# Patient Record
Sex: Female | Born: 1975 | Race: White | Hispanic: No | Smoking: Current every day smoker
Health system: Southern US, Community
[De-identification: ages and names within clinical notes are randomized; demographics above are authoritative.]

---

## 1997-09-01 ENCOUNTER — Ambulatory Visit (HOSPITAL_COMMUNITY): Admission: EM | Admit: 1997-09-01 | Discharge: 1997-09-01 | Payer: Self-pay | Admitting: Emergency Medicine

## 1998-09-07 ENCOUNTER — Emergency Department (HOSPITAL_COMMUNITY): Admission: EM | Admit: 1998-09-07 | Discharge: 1998-09-07 | Payer: Self-pay | Admitting: *Deleted

## 2000-03-07 ENCOUNTER — Encounter: Payer: Self-pay | Admitting: Emergency Medicine

## 2000-03-07 ENCOUNTER — Emergency Department (HOSPITAL_COMMUNITY): Admission: EM | Admit: 2000-03-07 | Discharge: 2000-03-07 | Payer: Self-pay | Admitting: Emergency Medicine

## 2003-01-25 ENCOUNTER — Emergency Department (HOSPITAL_COMMUNITY): Admission: EM | Admit: 2003-01-25 | Discharge: 2003-01-25 | Payer: Self-pay | Admitting: *Deleted

## 2003-02-09 ENCOUNTER — Emergency Department (HOSPITAL_COMMUNITY): Admission: EM | Admit: 2003-02-09 | Discharge: 2003-02-09 | Payer: Self-pay | Admitting: Emergency Medicine

## 2016-04-02 ENCOUNTER — Encounter (HOSPITAL_COMMUNITY): Payer: Self-pay | Admitting: Emergency Medicine

## 2016-04-02 ENCOUNTER — Emergency Department (HOSPITAL_COMMUNITY)
Admission: EM | Admit: 2016-04-02 | Discharge: 2016-04-02 | Disposition: A | Discharge status: Home / Self Care | Payer: Self-pay | Attending: Emergency Medicine | Admitting: Emergency Medicine

## 2016-04-02 ENCOUNTER — Emergency Department (HOSPITAL_COMMUNITY): Payer: Self-pay

## 2016-04-02 DIAGNOSIS — M25562 Pain in left knee: Secondary | ICD-10-CM | POA: Insufficient documentation

## 2016-04-02 DIAGNOSIS — F17200 Nicotine dependence, unspecified, uncomplicated: Secondary | ICD-10-CM | POA: Insufficient documentation

## 2016-04-02 MED ORDER — IBUPROFEN 800 MG PO TABS: 800.0000 mg | ORAL_TABLET | Freq: Three times a day (TID) | ORAL | 0 refills | Status: DC

## 2016-04-02 NOTE — ED Triage Notes (Signed)
Per EMS- pt called EMS with c/o acute l/knee pain. Pt stated that she was sitting on her l/knee and attempted to stand. Pt felt a popping sensation. Pt is alert, oriented and appropriate. Friend at bedside

## 2016-04-02 NOTE — ED Provider Notes (Signed)
[facility]-EMERGENCY DEPT Provider Note   CSN: [id number] Arrival date & time: 04/02/16  1502   By signing my name below, I, Soijett Blue, attest that this documentation has been prepared under the direction and in the presence of Sharilyn Sites, PA-C Electronically Signed: Soijett Blue, ED Scribe. 04/02/16. 5:00 PM.  History   Chief Complaint Chief Complaint  Patient presents with  . Knee Pain    HPI Tiffany Vega is a 41 y.o. female who presents to the Emergency Department broughti n by EMS complaining of left knee pain onset PTA. Pt notes that she was sitting on her left knee and upon standing she felt a popping sensation to her left knee. Pt states that it felt as if she popped her left knee "out of place" and this has occurred once before 7 years ago. Pt reports that her left knee feels back in placed prior to the xray conducted in the ED. Pt is having associated symptoms of gait problem due to pain. She hasn't tried any medications for the relief of her symptoms. She denies swelling, color change, wound, and any other symptoms.    The history is provided by the patient. No language interpreter was used.    No past medical history on file.  There are no active problems to display for this patient.   Past Surgical History:  Procedure Laterality Date  . CESAREAN SECTION      OB History    No data available       Home Medications    Prior to Admission medications   Not on File    Family History No family history on file.  Social History Social History  Substance Use Topics  . Smoking status: Current Every Day Smoker  . Smokeless tobacco: Not on file  . Alcohol use No     Allergies   Patient has no known allergies.   Review of Systems Review of Systems  Musculoskeletal: Positive for arthralgias (left knee) and gait problem (due to pain). Negative for joint swelling.  Skin: Negative for color change and wound.  All other systems reviewed and are  negative.    Physical Exam Updated Vital Signs BP 122/80   Pulse 70   Temp 98.3 F (36.8 C) (Oral)   Resp 16   Ht 5\' 8"  (1.727 m)   Wt 181 lb (82.1 kg)   LMP 03/15/2016   SpO2 100%   BMI 27.52 kg/m   Physical Exam  Constitutional: She is oriented to person, place, and time. She appears well-developed and well-nourished.  HENT:  Head: Normocephalic and atraumatic.  Mouth/Throat: Oropharynx is clear and moist.  Eyes: Conjunctivae and EOM are normal. Pupils are equal, round, and reactive to light.  Neck: Normal range of motion.  Cardiovascular: Normal rate, regular rhythm and normal heart sounds.   Pulmonary/Chest: Effort normal and breath sounds normal.  Abdominal: Soft. Bowel sounds are normal.  Musculoskeletal: Normal range of motion.       Left knee: She exhibits no swelling and no deformity. Tenderness found. Lateral joint line tenderness noted.  Mild tenderness of the superior left knee extending along the lateral joint line. No significant swelling or deformity. No signs of tendon rupture, quad and calf muscles contracting normally. Normal flexion/extension, patella tracking normally.  dp pulse intact. Nl sensation.   Neurological: She is alert and oriented to person, place, and time.  Skin: Skin is warm and dry.  Psychiatric: She has a normal mood and affect.  Nursing  note and vitals reviewed.    ED Treatments / Results  DIAGNOSTIC STUDIES: Oxygen Saturation is 100% on RA, nl by my interpretation.    COORDINATION OF CARE: 4:57 PM Discussed treatment plan with pt at bedside which includes left knee xray, ice, compression, knee sleeve, motrin, and pt agreed to plan.   Radiology Dg Knee Complete 4 Views Left  Result Date: 04/02/2016 CLINICAL DATA:  Acute diffuse left knee pain with decreased range of motion. Popping sensation in the left knee. 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 The knee is located without acute fracture. No significant joint space narrowing. No evidence for a joint effusion. IMPRESSION: No acute abnormality in left knee. 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 Electronically Signed   By: Richarda OverlieAdam  Henn M.D.   On: 04/02/2016 16:46    Procedures Procedures (including critical care time)  Medications Ordered in ED Medications - No data to display   Initial Impression / Assessment and Plan / ED Course  I have reviewed the triage vital signs and the nursing notes.  Pertinent imaging results that were available during my care of the patient were reviewed by me and considered in my medical decision making (see chart for details).  41 year old female here with left knee pain. Possible patellar dislocation with spontaneous relocation. No gross deformities on exam. Quadriceps and gastrocnemius muscles are contracting normally, no signs of tendon rupture.  No acute findings noted on x-ray.  Knee sleeve applied.  Recommended RICE and NSAIDs at home.  Given orthopedic follow if not improving over the next week.  Discussed plan with patient, she acknowledged understanding and agreed with plan of care.  Return precautions given for new or worsening symptoms.  Final Clinical Impressions(s) / ED Diagnoses   Final diagnoses:  Acute pain of left knee    New Prescriptions New Prescriptions   IBUPROFEN (ADVIL,MOTRIN) 800 MG TABLET    Take 1 tablet (800 mg total) by mouth 3 (three) times daily.   I personally performed the services described in this documentation, which was scribed in my presence. The recorded information has been reviewed and is accurate.   Garlon HatchetLisa M Chrissa Meetze, PA-C 04/02/16 1710    Gwyneth SproutWhitney Plunkett, MD 04/03/16 2128

## 2016-04-02 NOTE — ED Notes (Signed)
Ice pack applied to L knee

## 2016-04-02 NOTE — Discharge Instructions (Signed)
Take the prescribed medication as directed.  Recommend to ice and elevate knee at home to help with pain.  Wear brace to help with stabilization. Follow-up with orthopedics if you continue having issues.  May call to make appt. Return to the ED for new or worsening symptoms.

## 2017-09-07 IMAGING — CR DG KNEE COMPLETE 4+V*L*
5 series · 5 of 5 positions shown · non-contrast
Comparison: Left knee report from 03/07/2000

CLINICAL DATA: Acute diffuse left knee pain with decreased range of
motion. Popping sensation in the left knee.

EXAM:
LEFT KNEE - COMPLETE 4+ VIEW

[t knee ap left]
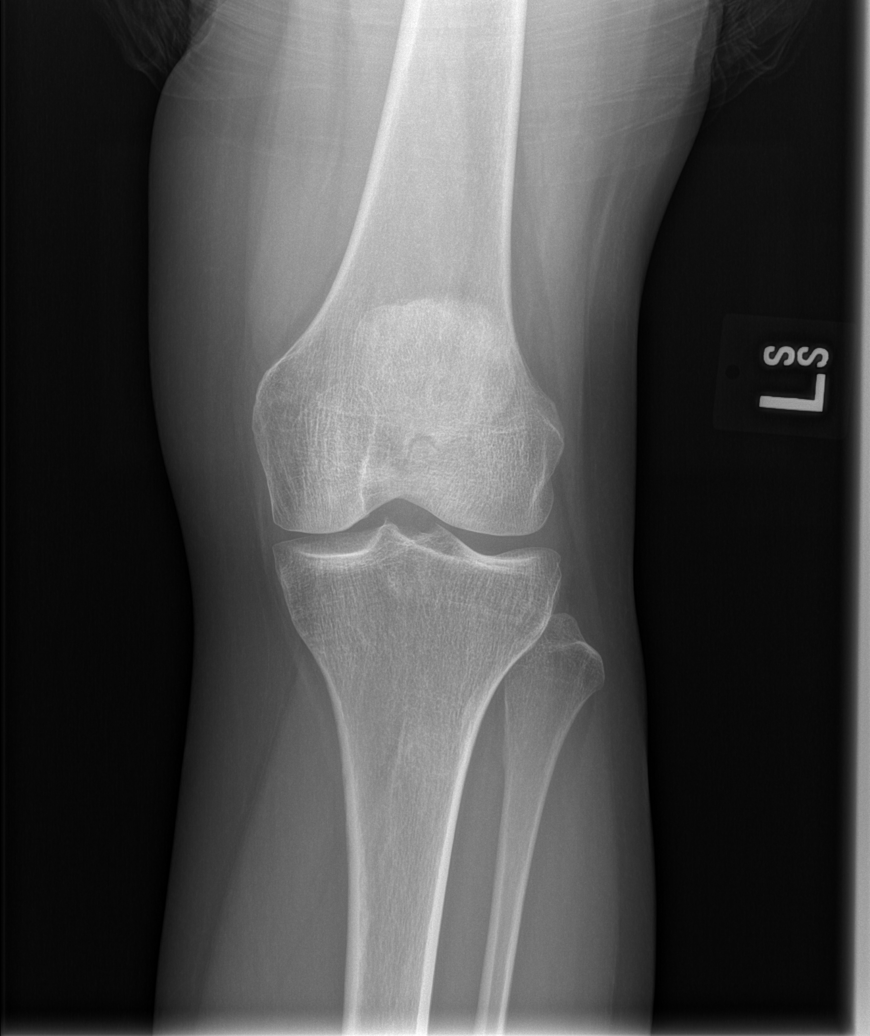

[t knee obl left (1 of 2)]
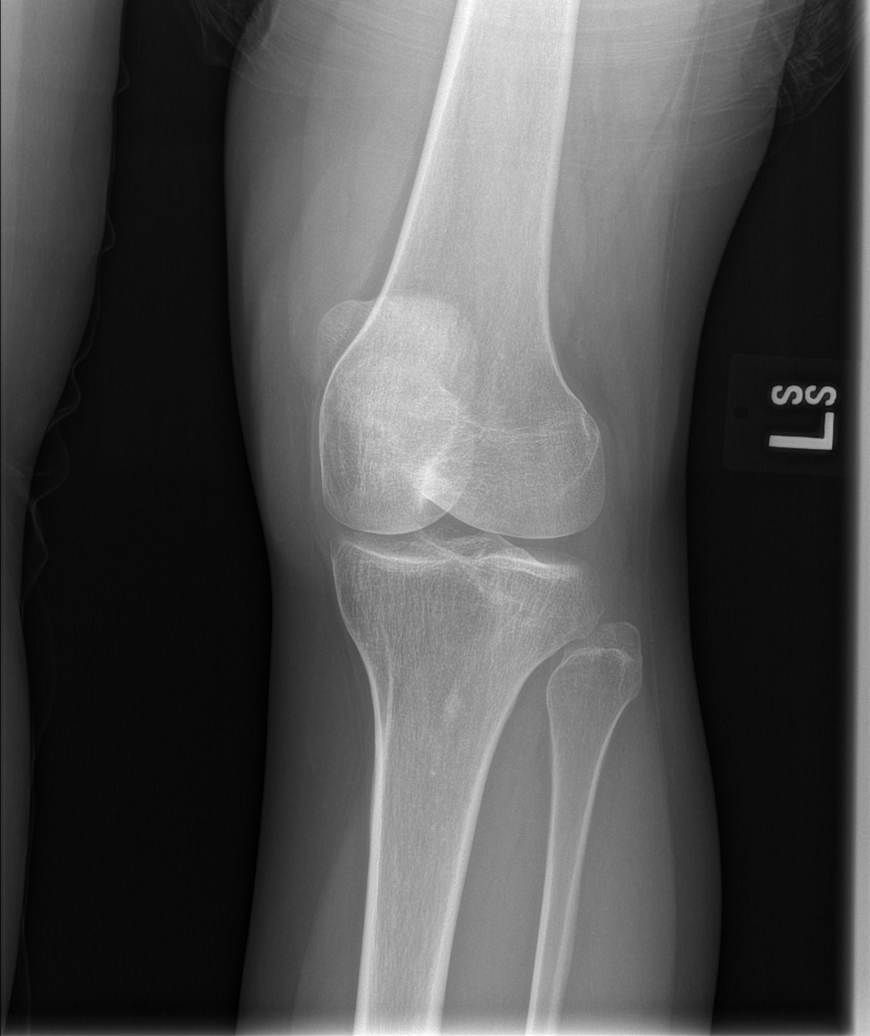

[t knee obl left (2 of 2)]
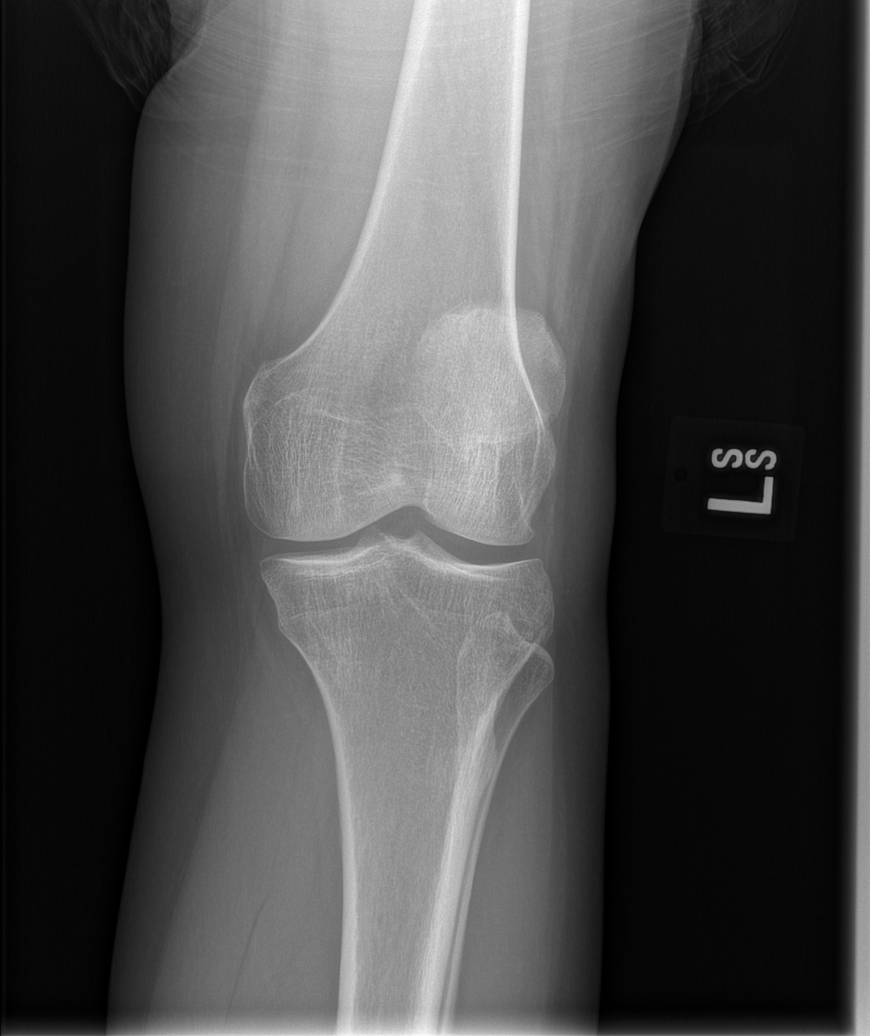

[t knee lat left (1 of 2)]
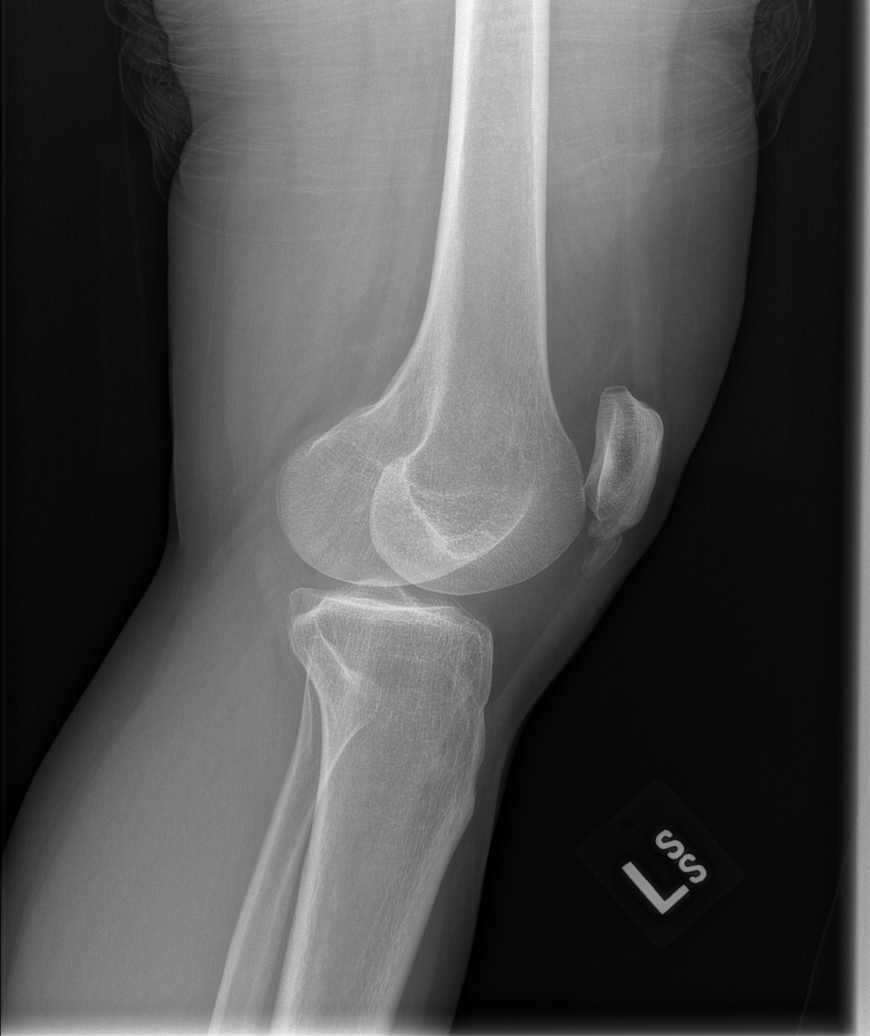

[t knee lat left (2 of 2)]
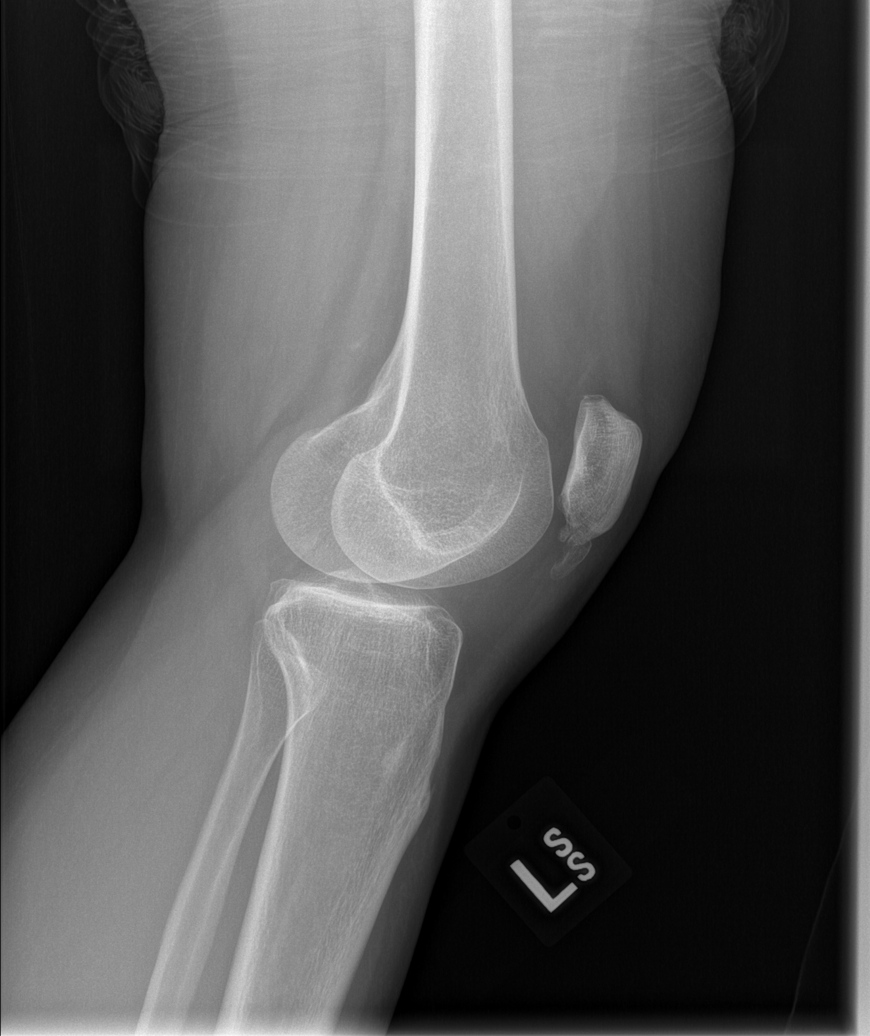

[5 of 5 positions shown; findings below may reference images not displayed]

FINDINGS: Prominent ossification along the inferior aspect of the patella
appears to be chronic based on the previous report. The knee is
located without acute fracture. No significant joint space
narrowing. No evidence for a joint effusion.
IMPRESSION: No acute abnormality in left knee.

Prominent ossification along the inferior aspect of the patella
appears to be chronic.

## 2018-05-25 ENCOUNTER — Encounter: Payer: Self-pay | Admitting: Physician Assistant

## 2018-05-25 ENCOUNTER — Telehealth: Payer: Self-pay | Admitting: Physician Assistant

## 2018-05-25 DIAGNOSIS — J029 Acute pharyngitis, unspecified: Secondary | ICD-10-CM

## 2018-05-25 DIAGNOSIS — R059 Cough, unspecified: Secondary | ICD-10-CM

## 2018-05-25 DIAGNOSIS — R05 Cough: Secondary | ICD-10-CM

## 2018-05-25 MED ORDER — AMOXICILLIN 500 MG PO CAPS: 500.0000 mg | ORAL_CAPSULE | Freq: Three times a day (TID) | ORAL | 0 refills | Status: DC

## 2018-05-25 MED ORDER — BENZONATATE 100 MG PO CAPS: 100.0000 mg | ORAL_CAPSULE | Freq: Three times a day (TID) | ORAL | 0 refills | Status: DC | PRN

## 2018-05-25 NOTE — Progress Notes (Signed)
E-Visit for Corona Virus Screening  Based on your current symptoms, it seems unlikely that your symptoms are related to the Coronavirus.   Coronavirus disease 2019 (COVID-19) is a respiratory illness that can spread from person to person. The virus that causes COVID-19 is a new virus that was first identified in the country of [place] but is now found in multiple other countries and has spread to the [place].  Symptoms associated with the virus are mild to severe fever, cough, and shortness of breath. There is currently no vaccine to protect against COVID-19, and there is no specific antiviral treatment for the virus.   To be considered HIGH RISK for Coronavirus (COVID-19), you have to meet the following criteria:  . Traveled to [place], [place], [place], [place] or [place]; or in the [place] to [place], [place], [place], or [place]; and have fever, cough, and shortness of breath within the last 2 weeks of travel OR  . Been in close contact with a person diagnosed with COVID-19 within the last 2 weeks and have fever, cough, and shortness of breath  . IF YOU DO NOT MEET THESE CRITERIA, YOU ARE CONSIDERED LOW RISK FOR COVID-19.   It is vitally important that if you feel that you have an infection such as this virus or any other virus that you stay home and away from places where you may spread it to others.  You should self-quarantine for 14 days if you have symptoms that could potentially be coronavirus and avoid contact with people age 52 and older.   You can use medication such as A prescription cough medication called Tessalon Perles 100 mg. You may take 1-2 capsules every 8 hours as needed for cough   I will also send in antibiotics, Amoxicillin 500 mg, take one pill twice daily for 10 days for your sore throat. Your sore throat is less likely bacterial in nature, but if your sore throat persists, worsens, you may start the prescribed antibiotics. I have provided a work note for the  next 2 days. IF any changes to your symptoms submit any Evisit or follow up with your primary care doctor.   You may also take acetaminophen (Tylenol) as needed for fever.   Reduce your risk of any infection by using the same precautions used for avoiding the common cold or flu:  Marland Kitchen Wash your hands often with soap and warm water for at least 20 seconds.  If soap and water are not readily available, use an alcohol-based hand sanitizer with at least 60% alcohol.  . If coughing or sneezing, cover your mouth and nose by coughing or sneezing into the elbow areas of your shirt or coat, into a tissue or into your sleeve (not your hands). . Avoid shaking hands with others and consider head nods or verbal greetings only. . Avoid touching your eyes, nose, or mouth with unwashed hands.  . Avoid close contact with people who are sick. . Avoid places or events with large numbers of people in one location, like concerts or sporting events. . Carefully consider travel plans you have or are making. . If you are planning any travel outside or inside the [place], visit the CDC's Travelers' Health webpage for the latest health notices. . If you have some symptoms but not all symptoms, continue to monitor at home and seek medical attention if your symptoms worsen. . If you are having a medical emergency, call 911.  HOME CARE . Only take medications  as instructed by your medical team. . Drink plenty of fluids and get plenty of rest. . A steam or ultrasonic humidifier can help if you have congestion.   GET HELP RIGHT AWAY IF: . You develop worsening fever. . You become short of breath . You cough up blood. . Your symptoms become more severe MAKE SURE YOU   Understand these instructions.  Will watch your condition.  Will get help right away if you are not doing well or get worse.  Your e-visit answers were reviewed by a board certified advanced clinical practitioner to complete your personal care plan.   Depending on the condition, your plan could have included both over the counter or prescription medications.  If there is a problem please reply once you have received a response from your provider. Your safety is important to [place].  If you have drug allergies check your prescription carefully.    You can use MyChart to ask questions about today's visit, request a non-urgent call back, or ask for a work or school excuse for 24 hours related to this e-Visit. If it has been greater than 24 hours you will need to follow up with your provider, or enter a new e-Visit to address those concerns. You will get an e-mail in the next two days asking about your experience.  I hope that your e-visit has been valuable and will speed your recovery. Thank you for using e-visits.   I have spent 7 min in completion and review of this note- [facility]

## 2018-10-27 ENCOUNTER — Other Ambulatory Visit: Payer: Self-pay

## 2018-10-27 ENCOUNTER — Emergency Department (HOSPITAL_COMMUNITY)
Admission: EM | Admit: 2018-10-27 | Discharge: 2018-10-28 | Disposition: A | Discharge status: Home / Self Care | Payer: Self-pay | Attending: Emergency Medicine | Admitting: Emergency Medicine

## 2018-10-27 ENCOUNTER — Encounter (HOSPITAL_COMMUNITY): Payer: Self-pay

## 2018-10-27 DIAGNOSIS — F1210 Cannabis abuse, uncomplicated: Secondary | ICD-10-CM

## 2018-10-27 DIAGNOSIS — F19959 Other psychoactive substance use, unspecified with psychoactive substance-induced psychotic disorder, unspecified: Secondary | ICD-10-CM

## 2018-10-27 DIAGNOSIS — F1220 Cannabis dependence, uncomplicated: Secondary | ICD-10-CM | POA: Insufficient documentation

## 2018-10-27 DIAGNOSIS — Y929 Unspecified place or not applicable: Secondary | ICD-10-CM | POA: Insufficient documentation

## 2018-10-27 DIAGNOSIS — R440 Auditory hallucinations: Secondary | ICD-10-CM | POA: Insufficient documentation

## 2018-10-27 DIAGNOSIS — W19XXXA Unspecified fall, initial encounter: Secondary | ICD-10-CM | POA: Insufficient documentation

## 2018-10-27 DIAGNOSIS — Y939 Activity, unspecified: Secondary | ICD-10-CM | POA: Insufficient documentation

## 2018-10-27 DIAGNOSIS — F17210 Nicotine dependence, cigarettes, uncomplicated: Secondary | ICD-10-CM | POA: Insufficient documentation

## 2018-10-27 DIAGNOSIS — Y999 Unspecified external cause status: Secondary | ICD-10-CM | POA: Insufficient documentation

## 2018-10-27 DIAGNOSIS — R462 Strange and inexplicable behavior: Secondary | ICD-10-CM | POA: Insufficient documentation

## 2018-10-27 MED ORDER — ACETAMINOPHEN 325 MG PO TABS: 650.0000 mg | ORAL_TABLET | ORAL | Status: DC | PRN

## 2018-10-27 NOTE — ED Triage Notes (Signed)
Patient coming from home with complaints of a fall after smoking too much marijuana. Patient started smoking this morning and became dizzy with ambulation. Patient reports hitting her head with the fall but did not lose consciousness. Patient also wants to been seen by behavorial health, but denies HI and SI.
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
## 2018-10-28 ENCOUNTER — Encounter (HOSPITAL_COMMUNITY): Payer: Self-pay | Admitting: Registered Nurse

## 2018-10-28 DIAGNOSIS — F19959 Other psychoactive substance use, unspecified with psychoactive substance-induced psychotic disorder, unspecified: Secondary | ICD-10-CM

## 2018-10-28 NOTE — ED Notes (Signed)
TTS at bedside. 
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
## 2018-10-28 NOTE — ED Notes (Signed)
Patient states that she has been hearing voices for a week and didn't want to tell anyone, including the counselor during evaluation. Patient states that the voices tell her things, but do not tell her to harm herself or other people. Will continue to monitor patient. EDP made aware.

## 2018-10-28 NOTE — BH Assessment (Signed)
[facility] Assessment Progress Note   Patient update provided by Dr. Thayer Jew.  Patient upon d/c stated that she has indeed been hearing voices.  Voices reportedly are telling her to do things but not any injurious behavior.  Clinician did talk with Anette Riedel, NP and she recommended an AM psych consult for patient.

## 2018-10-28 NOTE — BHH Suicide Risk Assessment (Cosign Needed Addendum)
Suicide Risk Assessment  Discharge Assessment   [facility] Discharge Suicide Risk Assessment   Principal Problem: Psychoactive substance-induced psychosis ([place]) Discharge Diagnoses: Principal Problem:   Psychoactive substance-induced psychosis ([place])   Total Time spent with patient: 30 minutes  Musculoskeletal: Strength & Muscle Tone: within normal limits Gait & Station: normal Patient leans: N/A  Psychiatric Specialty Exam:   Blood pressure 132/67, pulse 75, temperature 98.1 F (36.7 C), temperature source Oral, resp. rate 16, SpO2 97 %.There is no height or weight on file to calculate BMI.  General Appearance: Casual  Eye Contact::  Good  Speech:  Clear and Coherent and Normal Rate409  Volume:  Normal  Mood:  "Great" Appropriate  Affect:  Appropriate and Congruent  Thought Process:  Coherent, Goal Directed and Descriptions of Associations: Intact  Orientation:  Full (Time, Place, and Person)  Thought Content:  WDL  Suicidal Thoughts:  No  Homicidal Thoughts:  No  Memory:  Immediate;   Good Recent;   Good  Judgement:  Intact  Insight:  Present  Psychomotor Activity:  Normal  Concentration:  Good  Recall:  Good  Fund of Knowledge:Good  Language: Good  Akathisia:  No  Handed:  Right  AIMS (if indicated):     Assets:  Communication Skills Desire for Improvement Housing Physical Health Social Support Transportation  Sleep:     Cognition: WNL  ADL's:  Intact   Mental Status Per Nursing Assessment::   On Admission:    Tiffany Vega, 43 y.o., female patient seen via tele psych by this provider, Dr. Mariea Clonts; and chart reviewed on 10/28/18.  On evaluation Tiffany Vega reports "I was at home bored and I smoked a lot of marijuana.  It was really a lot.  I've never smoked that much before."  Patient also states that she had slept much that day.  Patient denies psychiatric history, prior suicide attempt, self-harm, violence.  Sates that she lives with her mother.  States  that he brother brought her in because he was concerned related to hearing the voices but states it only occurred after the marijuana use and that she is no longer hearing voices and is feeling much better today; and slept well last night.  Patient states that "I am never touching that stuff again."  At this time patient denies suicidal/self-harm/homicidal ideation, psychosis, and paranoia.   During evaluation Tiffany Vega is alert/oriented x 4; calm/cooperative; and mood is congruent with affect.  She does not appear to be responding to internal/external stimuli or delusional thoughts.  Patient denies suicidal/self-harm/homicidal ideation, psychosis, and paranoia.  Patient answered question appropriately.     Demographic Factors:  Caucasian  Loss Factors: NA  Historical Factors: NA  Risk Reduction Factors:   Sense of responsibility to family, Religious beliefs about death, Living with another person, especially a relative and Positive social support  Continued Clinical Symptoms:  Alcohol/Substance Abuse/Dependencies  Cognitive Features That Contribute To Risk:  None    Suicide Risk:  Minimal: No identifiable suicidal ideation.  Patients presenting with no risk factors but with morbid ruminations; may be classified as minimal risk based on the severity of the depressive symptoms    Plan Of Care/Follow-up recommendations:  Activity:  As tolerated Diet:  Heart healthy Other:  Follow up with resouces given   Disposition: No evidence of imminent risk to self or others at present.   Patient does not meet criteria for psychiatric inpatient admission. Supportive therapy provided about ongoing stressors. Discussed crisis plan, support  from social network, calling 911, coming to the Emergency Department, and calling Suicide Hotline.   , NP 10/28/2018, 2:34 PM

## 2018-10-28 NOTE — ED Provider Notes (Signed)
Spoke to to behavioral health counselor.  Does not recommend inpatient evaluation.  Patient was discharged.  Upon discharge, she endorsed to the nurse that she had not been truthful.  She reports that she has been hearing voices and that they are getting worse.  I have requested the TTS be re-consulted.  4:47 AM Spoke with Beverely Low.  He was updated on the patient's endorsement of hearing voices.  He will discussed with NP but a.m. psychiatry evaluation.  I highly suspect that her hallucinations are related to excessive marijuana use.   Merryl Hacker, MD 10/28/18 [phone]

## 2018-10-28 NOTE — ED Notes (Signed)
Pt given lunch tray.

## 2018-10-28 NOTE — ED Notes (Addendum)
Patient ambulated to restroom with no assistance. Patient denies any needs at this time. Will continue to monitor patient.

## 2022-04-28 ENCOUNTER — Emergency Department (HOSPITAL_COMMUNITY)
Admission: EM | Admit: 2022-04-28 | Discharge: 2022-04-28 | Disposition: A | Discharge status: Home / Self Care | Payer: Self-pay | Attending: Emergency Medicine | Admitting: Emergency Medicine

## 2022-04-28 ENCOUNTER — Other Ambulatory Visit: Payer: Self-pay

## 2022-04-28 ENCOUNTER — Encounter (HOSPITAL_COMMUNITY): Payer: Self-pay

## 2022-04-28 ENCOUNTER — Emergency Department (HOSPITAL_COMMUNITY): Payer: Self-pay

## 2022-04-28 DIAGNOSIS — R030 Elevated blood-pressure reading, without diagnosis of hypertension: Secondary | ICD-10-CM | POA: Insufficient documentation

## 2022-04-28 DIAGNOSIS — W2189XA Striking against or struck by other sports equipment, initial encounter: Secondary | ICD-10-CM | POA: Insufficient documentation

## 2022-04-28 DIAGNOSIS — Z23 Encounter for immunization: Secondary | ICD-10-CM | POA: Insufficient documentation

## 2022-04-28 DIAGNOSIS — L03116 Cellulitis of left lower limb: Secondary | ICD-10-CM | POA: Insufficient documentation

## 2022-04-28 MED ORDER — TETANUS-DIPHTH-ACELL PERTUSSIS 5-2.5-18.5 LF-MCG/0.5 IM SUSY
0.5000 mL | PREFILLED_SYRINGE | Freq: Once | INTRAMUSCULAR | Status: AC
  Administered 2022-04-28: 0.5 mL via INTRAMUSCULAR
  Filled 2022-04-28: qty 0.5

## 2022-04-28 MED ORDER — CEPHALEXIN 500 MG PO CAPS: 500.0000 mg | ORAL_CAPSULE | Freq: Four times a day (QID) | ORAL | 0 refills | Status: AC

## 2022-04-28 MED ORDER — CEPHALEXIN 500 MG PO CAPS: 500.0000 mg | ORAL_CAPSULE | Freq: Four times a day (QID) | ORAL | 0 refills | Status: DC

## 2022-04-28 MED ORDER — CEPHALEXIN 250 MG PO CAPS
500.0000 mg | ORAL_CAPSULE | Freq: Once | ORAL | Status: AC
Start: 2022-04-28 — End: 2022-04-28
  Administered 2022-04-28: 500 mg via ORAL
  Filled 2022-04-28: qty 2

## 2022-04-28 NOTE — ED Triage Notes (Signed)
Pt states she has a raised birthmark on anterior of left foot and you it on end of barbell and it has dislodged half of the birthmark up off the footx6 days ago. Pt states it started swelling on Tuesday, but got worse yesterday. The area surrounding the birthmark is erythematous. Pt has 1+ swelling of left foot. Pt has 2+ left pedal pulse, cap refill less than 3 sec, warm to touch.

## 2022-04-28 NOTE — ED Provider Notes (Signed)
[facility] Provider Note   CSN: [id number] Arrival date & time: 04/28/22  [id number]     History  Chief Complaint  Patient presents with   Extremity Laceration    Tiffany Vega is a 47 y.o. female reports otherwise healthy presents with her mother today for evaluation of injury to the dorsum of her left foot.  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  Has not caused any issue but we she was doing some weight lifting 6 days ago and the top of the barbell struck her birthmark causing it to pull backwards, she reports that she took care of the area at home initially however over the past few days she has noticed some redness and pain to the area pain is burning aching constant mild-moderate intensity worsens with palpation and improves somewhat with rest.  Pain does not radiate.  She has not been on any antibiotics prior to arrival.  She denies any history of diabetes or immunocompromising diseases.  She denies any history of fevers/chills, numbness or any additional concerns.  HPI     Home Medications Prior to Admission medications   Medication Sig Start Date End Date Taking? Authorizing Provider  cephALEXin (KEFLEX) 500 MG capsule Take 1 capsule (500 mg total) by mouth 4 (four) times daily for 7 days. 04/28/22 05/05/22 Yes Nuala Alpha A, PA-C      Allergies    Patient has no known allergies.    Review of Systems   Review of Systems Ten systems are reviewed and are negative for acute change except as noted in the HPI  Physical Exam Updated Vital Signs BP (!) 152/108   Pulse 74   Temp 97.8 F (36.6 C) (Oral)   Resp 20   Ht '5\' 8"'$  (1.727 m)   Wt 82.1 kg   SpO2 99%   BMI 27.52 kg/m  Physical Exam Constitutional:      General: She is not in acute distress.    Appearance: Normal appearance. She is well-developed. She is not ill-appearing or diaphoretic.  HENT:     Head: Normocephalic and atraumatic.   Eyes:     General: Vision grossly intact. Gaze aligned appropriately.     Pupils: Pupils are equal, round, and reactive to light.  Neck:     Trachea: Trachea and phonation normal.  Pulmonary:     Effort: Pulmonary effort is normal. No respiratory distress.  Abdominal:     General: There is no distension.     Palpations: Abdomen is soft.     Tenderness: There is no abdominal tenderness. There is no guarding or rebound.  Musculoskeletal:        General: Normal range of motion.     Cervical back: Normal range of motion.  Feet:     Comments: Left foot: Large birthmark present to the dorsum of the left foot.  Small apparent laceration to the anterior aspect of the birthmark with granulation tissue.  Mild surrounding erythema.  No fluctuance or induration.  Mild increased warmth.  No streaking, drainage or bleeding.  Pain with palpation around the birthmark.  No tenderness to the toes or MTPs.  No tenderness to the plantar aspect of the foot or medial/lateral edges.  No tenderness to the calcaneus, medial/lateral malleolus, anterior ankle, Achilles tendon.  Full ROM of the left foot, toes and ankle.  Strong pedal pulse.  Capillary fill and sensation intact.  Compartments soft. Skin:  General: Skin is warm and dry.  Neurological:     Mental Status: She is alert.     GCS: GCS eye subscore is 4. GCS verbal subscore is 5. GCS motor subscore is 6.     Comments: Speech is clear and goal oriented, follows commands Major Cranial nerves without deficit, no facial droop Moves extremities without ataxia, coordination intact  Psychiatric:        Behavior: Behavior normal.     ED Results / Procedures / Treatments   Labs (all labs ordered are listed, but only abnormal results are displayed) Labs Reviewed - No data to display  EKG None  Radiology DG Foot Complete Left  Result Date: 04/28/2022 CLINICAL DATA:  47 year old female with history of laceration on the dorsum of the foot 6 days ago  presenting with cellulitis at this time. EXAM: LEFT FOOT - COMPLETE 3+ VIEW COMPARISON:  No priors.  Specifically, FINDINGS: There is no evidence of fracture or dislocation. There is no evidence of arthropathy or other focal bone abnormality. Soft tissues are unremarkable. No retained radiopaque foreign bodies in the visualized soft tissues. IMPRESSION: Negative. Electronically Signed   By: Vinnie Langton M.D.   On: 04/28/2022 11:23    Procedures Procedures    Medications Ordered in ED Medications  cephALEXin (KEFLEX) capsule 500 mg (has no administration in time range)  Tdap (BOOSTRIX) injection 0.5 mL (has no administration in time range)    ED Course/ Medical Decision Making/ A&P                             Medical Decision Making 47 year old female otherwise healthy presented for evaluation of injury to the dorsum of her left foot.  6 days ago a barbell struck her birthmark when she was working out barefoot, her birthmark had a laceration, she reports the area was initially healing well but over the past few days she has developed erythema and pain to the area.  On examination patient has cellulitis around her birthmark, there is no drainage, no streaking.  She has no systemic symptoms and her vital signs are overall stable no fever or tachycardia to suggest systemic infection.  Plan to obtain radiographs of the left foot to rule out underlying osseous injury or radiopaque foreign bodies.  Amount and/or Complexity of Data Reviewed Radiology: ordered.    Details: I personally reviewed and interpreted patient's three-view left foot radiographs.  I do not appreciate any obvious acute displaced fracture, dislocation or osseous lesions.  No obvious radiopaque foreign bodies.  Risk OTC drugs. Prescription drug management. Risk Details: Patient was reassessed she is resting comfortably in bed no acute distress.  Diagnosis of cellulitis of the dorsum of the left foot.  There is no indication  for repair, injury is 6 days out and she has developed granulation tissue to the area.  Low suspicion for septic joint, osteomyelitis, DVT, compartment syndrome, open fracture, sepsis or other emergent pathology at this time.  No indication for blood work or IV antibiotics.  No indication for further imaging.  Will begin treatment with Keflex 500 mg 4 times daily x 7 days.  I have asked patient to see her primary care provider in 2-3 days for recheck to ensure improvement.  I discussed strict ER return precautions patient and her mother and they stated understanding.  All questions were answered.  Patient was given postop shoe and crutches for comfort.  Tdap updated after risks versus  benefits discussion.  Wound was dressed in the ER.  Of note patient denies history of CKD or adverse reaction antibiotics in the past.  No dose adjustment indicated for Keflex.  Incidental elevated blood pressure noted, patient given hypertension handout, no symptoms to suggest hypertensive urgency/emergency.  Encouraged BP recheck in 3 days with primary care provider.  Patient denies chance of pregnancy today, reports no sexual activity and with her menopause, she declined pregnancy test.      At this time there does not appear to be any evidence of an acute emergency medical condition and the patient appears stable for discharge with appropriate outpatient follow up. Diagnosis was discussed with patient who verbalizes understanding of care plan and is agreeable to discharge. I have discussed return precautions with patient and her mother who verbalizes understanding. Patient encouraged to follow-up with their PCP. All questions answered.  Patient's case discussed with Dr. Telford Nab who agrees with plan to discharge with follow-up.   Note: Portions of this report may have been transcribed using voice recognition software. Every effort was made to ensure accuracy; however, inadvertent computerized transcription errors may  still be present.         Final Clinical Impression(s) / ED Diagnoses Final diagnoses:  Cellulitis of left foot  Elevated blood pressure reading    Rx / DC Orders ED Discharge Orders          Ordered    cephALEXin (KEFLEX) 500 MG capsule  4 times daily        04/28/22 1150              Gari Crown 04/28/22 1152    Isla Pence, MD 05/02/22 1610

## 2022-04-28 NOTE — Discharge Instructions (Addendum)
At this time there does not appear to be the presence of an emergent medical condition, however there is always the potential for conditions to change. Please read and follow the below instructions.  Please return to the Emergency Department immediately for any new or worsening symptoms or if your symptoms do not improve within 3 days. Please be sure to follow up with your Primary Care Provider within one week regarding your visit today; please call their office to schedule an appointment even if you are feeling better for a follow-up visit. Please take your antibiotic Keflex as prescribed until complete to help with your symptoms.  Please drink enough water to avoid dehydration and get plenty of rest. Please have your primary care doctor checkup on your left foot in their office in the next 3 days even if your symptoms are improving.  You may use the postop shoe and crutches to help keep the weight off of your foot.  Please perform daily bandage changes and you may rinse the area gently with clean soapy water every day. Your blood pressure was slightly elevated in the emergency department today.  Please have your blood pressure rechecked by your doctor at your follow-up visit in 3 days.   Please read the additional information packets attached to your discharge summary.  Go to the nearest Emergency Department immediately if: You have fever or chills You do not start to get better after 1-2 days of treatment. Your bone or joint under the infected area starts to hurt after the skin has healed. Your infection comes back in the same area or another area. Signs of this may include: You have a swollen bump in the area. Your red area gets larger, turns dark in color, or hurts more. You have more fluid coming from the wound. Pus or a bad smell develops in your infected area. You have more pain. You feel sick and have muscle aches and weakness. You develop vomiting or watery poop that will not go  away. You have any new/concerning or worsening of symptoms  Do not take your medicine if  develop an itchy rash, swelling in your mouth or lips, or difficulty breathing; call 911 and seek immediate emergency medical attention if this occurs.  You may review your lab tests and imaging results in their entirety on your MyChart account.  Please discuss all results of fully with your primary care provider and other specialist at your follow-up visit.  Note: Portions of this text may have been transcribed using voice recognition software. Every effort was made to ensure accuracy; however, inadvertent computerized transcription errors may still be present.

## 2022-04-28 NOTE — ED Notes (Signed)
Up to b/r for urine sample. Denies pain while sitting, increases with ambulation.

## 2022-04-28 NOTE — ED Notes (Signed)
Pt alert, NAD, calm, interactive, steady gait. C/o L anterior foot "birthmark/growth" injured and irritated. Color and size normal for pt. New surrounding redness and TTP. Denies fever, bleeding, drainage.

## 2022-04-28 NOTE — Progress Notes (Signed)
Orthopedic Tech Progress Note Patient Details:  DEMRI BRUGH Sep 26, 1975 [id number] Patient stated that she did not want the post op shoe due to the top strap irritating her foot  Ortho Devices Type of Ortho Device: Crutches Ortho Device/Splint Interventions: Ordered, Adjustment   Post Interventions Patient Tolerated: Ambulated well  Liane Comber E Braelin Costlow 04/28/2022, 12:21 PM

## 2024-02-03 ENCOUNTER — Telehealth: Admitting: Physician Assistant

## 2024-02-03 DIAGNOSIS — R52 Pain, unspecified: Secondary | ICD-10-CM | POA: Diagnosis not present

## 2024-02-03 DIAGNOSIS — R6889 Other general symptoms and signs: Secondary | ICD-10-CM

## 2024-02-03 MED ORDER — OSELTAMIVIR PHOSPHATE 75 MG PO CAPS: 75.0000 mg | ORAL_CAPSULE | Freq: Two times a day (BID) | ORAL | 0 refills | Status: AC

## 2024-02-03 NOTE — Progress Notes (Signed)
 E visit for Flu like symptoms   We are sorry that you are not feeling well.  Here is how we plan to help! Based on what you have shared with me it looks like you may have a respiratory virus that may be influenza.  Influenza or "the flu" is  an infection caused by a respiratory virus. The flu virus is highly contagious and persons who did not receive their yearly flu vaccination may "catch" the flu from close contact.  We have anti-viral medications to treat the viruses that cause this infection. They are not a "cure" and only shorten the course of the infection. These prescriptions are most effective when they are given within the first 2 days of "flu" symptoms. Antiviral medications are indicated if you have a high risk of complications from the flu. You should  also consider an antiviral medication if you are in close contact with someone who is at risk. These medications can help patients avoid complications from the flu but have side effects that you should know.   Possible side effects from Tamiflu  or oseltamivir  include nausea, vomiting, diarrhea, dizziness, headaches, eye redness, sleep problems or other respiratory symptoms. You should not take Tamiflu  if you have an allergy to oseltamivir  or any to the ingredients in Tamiflu .  Based upon your symptoms and potential risk factors I have prescribed Oseltamivir  (Tamiflu ).  It has been sent to your designated pharmacy.  You will take one 75 mg capsule orally twice a day for the next 5 days.   For nasal congestion, you may use an oral decongestant such as Mucinex D or if you have glaucoma or high blood pressure use plain Mucinex.  Saline nasal spray or nasal drops can help and can safely be used as often as needed for congestion.  If you have a sore or scratchy throat, use a saltwater gargle-  to  teaspoon of salt dissolved in a 4-ounce to 8-ounce glass of warm water .  Gargle the solution for approximately 15-30 seconds and then spit.  It is  important not to swallow the solution.  You can also use throat lozenges/cough drops and Chloraseptic spray to help with throat pain or discomfort.  Warm or cold liquids can also be helpful in relieving throat pain.  For headache, pain or general discomfort, you can use Ibuprofen  or Tylenol  as directed.   Some authorities believe that zinc sprays or the use of Echinacea may shorten the course of your symptoms.  You are to isolate at home until you have been fever-free for at least 24 hours without a fever-reducing medication, and symptoms have been steadily improving for 24 hours.  If you must be around other household members who do not have symptoms, you need to make sure that both you and the family members are masking consistently with a high-quality mask.  If you note any worsening of symptoms despite treatment, please seek an in-person evaluation ASAP. If you note any significant shortness of breath or any chest pain, please seek ED evaluation. Please do not delay care!  ANYONE WHO HAS FLU SYMPTOMS SHOULD: Stay home. The flu is highly contagious and going out or to work exposes others! Be sure to drink plenty of fluids. Water  is fine as well as fruit juices, sodas and electrolyte beverages. You may want to stay away from caffeine  or alcohol. If you are nauseated, try taking small sips of liquids. How do you know if you are getting enough fluid? Your urine should be a  pale yellow or almost colorless. Get rest. Taking a steamy shower or using a humidifier may help nasal congestion and ease sore throat pain. Using a saline nasal spray works much the same way. Cough drops, hard candies and sore throat lozenges may ease your cough. Line up a caregiver. Have someone check on you regularly.  GET HELP RIGHT AWAY IF: You cannot keep down liquids or your medications. You become short of breath Your fell like you are going to pass out or loose consciousness. Your symptoms persist after you have  completed your treatment plan  MAKE SURE YOU  Understand these instructions. Will watch your condition. Will get help right away if you are not doing well or get worse.  Your e-visit answers were reviewed by a board certified advanced clinical practitioner to complete your personal care plan.  Depending on the condition, your plan could have included both over the counter or prescription medications.  If there is a problem please reply  once you have received a response from your provider.  Your safety is important to us .  If you have drug allergies check your prescription carefully.    You can use MyChart to ask questions about today's visit, request a non-urgent call back, or ask for a work or school excuse for 24 hours related to this e-Visit. If it has been greater than 24 hours you will need to follow up with your provider, or enter a new e-Visit to address those concerns.  You will get an e-mail in the next two days asking about your experience.  I hope that your e-visit has been valuable and will speed your recovery. Thank you for using e-visits.   I have spent 5 minutes in review of e-visit questionnaire, review and updating patient chart, medical decision making and response to patient.   Elsie Velma Lunger, PA-C

## 2024-02-03 NOTE — Progress Notes (Signed)
 Message sent to patient requesting further input regarding current symptoms. Awaiting patient response.

## 2024-02-29 ENCOUNTER — Telehealth: Payer: Self-pay | Admitting: Family

## 2024-02-29 DIAGNOSIS — J208 Acute bronchitis due to other specified organisms: Secondary | ICD-10-CM | POA: Diagnosis not present

## 2024-02-29 DIAGNOSIS — B9689 Other specified bacterial agents as the cause of diseases classified elsewhere: Secondary | ICD-10-CM | POA: Diagnosis not present

## 2024-02-29 MED ORDER — DOXYCYCLINE HYCLATE 100 MG PO TABS: 100.0000 mg | ORAL_TABLET | Freq: Two times a day (BID) | ORAL | 0 refills | Status: AC

## 2024-02-29 MED ORDER — BENZONATATE 200 MG PO CAPS: 200.0000 mg | ORAL_CAPSULE | Freq: Two times a day (BID) | ORAL | 0 refills | Status: AC | PRN

## 2024-02-29 MED ORDER — PREDNISONE 10 MG (21) PO TBPK: ORAL_TABLET | ORAL | 0 refills | Status: AC

## 2024-02-29 NOTE — Progress Notes (Signed)
 We are sorry that you are not feeling well.  Here is how we plan to help!  Based on your presentation I believe you most likely have A cough due to bacteria.  When patients have a fever and a productive cough with a change in color or increased sputum production, we are concerned about bacterial bronchitis.  If left untreated it can progress to pneumonia.  If your symptoms do not improve with your treatment plan it is important that you contact your provider.   I have prescribed Doxycycline  100 mg twice a day for 7 days     In addition you may use A non-prescription cough medication called Robitussin DAC. Take 2 teaspoons every 8 hours or Delsym: take 2 teaspoons every 12 hours., A non-prescription cough medication called Mucinex DM: take 2 tablets every 12 hours., and A prescription cough medication called Tessalon  Perles 100mg . You may take 1-2 capsules every 8 hours as needed for your cough.  Prednisone  10 mg daily for 6 days (see taper instructions below)  From your responses in the eVisit questionnaire you describe inflammation in the upper respiratory tract which is causing a significant cough.  This is commonly called Bronchitis and has four common causes:   Allergies Viral Infections Acid Reflux Bacterial Infection Allergies, viruses and acid reflux are treated by controlling symptoms or eliminating the cause. An example might be a cough caused by taking certain blood pressure medications. You stop the cough by changing the medication. Another example might be a cough caused by acid reflux. Controlling the reflux helps control the cough.  USE OF BRONCHODILATOR (RESCUE) INHALERS: There is a risk from using your bronchodilator too frequently.  The risk is that over-reliance on a medication which only relaxes the muscles surrounding the breathing tubes can reduce the effectiveness of medications prescribed to reduce swelling and congestion of the tubes themselves.  Although you feel brief  relief from the bronchodilator inhaler, your asthma may actually be worsening with the tubes becoming more swollen and filled with mucus.  This can delay other crucial treatments, such as oral steroid medications. If you need to use a bronchodilator inhaler daily, several times per day, you should discuss this with your provider.  There are probably better treatments that could be used to keep your asthma under control.     HOME CARE Only take medications as instructed by your medical team. Complete the entire course of an antibiotic. Drink plenty of fluids and get plenty of rest. Avoid close contacts especially the very young and the elderly Cover your mouth if you cough or cough into your sleeve. Always remember to wash your hands A steam or ultrasonic humidifier can help congestion.   GET HELP RIGHT AWAY IF: You develop worsening fever. You become short of breath You cough up blood. Your symptoms persist after you have completed your treatment plan MAKE SURE YOU  Understand these instructions. Will watch your condition. Will get help right away if you are not doing well or get worse.  Your e-visit answers were reviewed by a board certified advanced clinical practitioner to complete your personal care plan.  Depending on the condition, your plan could have included both over the counter or prescription medications. If there is a problem please reply  once you have received a response from your provider. Your safety is important to us .  If you have drug allergies check your prescription carefully.    You can use MyChart to ask questions about todays visit, request  a non-urgent call back, or ask for a work or school excuse for 24 hours related to this e-Visit. If it has been greater than 24 hours you will need to follow up with your provider, or enter a new e-Visit to address those concerns. You will get an e-mail in the next two days asking about your experience.  I hope that your e-visit  has been valuable and will speed your recovery. Thank you for using e-visits.   I have spent 5 minutes in review of e-visit questionnaire, review and updating patient chart, medical decision making and response to patient.   Bari Learn, FNP
